# Patient Record
Sex: Male | Born: 1955 | Race: White | Hispanic: No | Marital: Married | Smoking: Never smoker
Health system: Southern US, Community
[De-identification: ages and names within clinical notes are randomized; demographics above are authoritative.]

## PROBLEM LIST (undated history)

## (undated) DIAGNOSIS — I959 Hypotension, unspecified: Secondary | ICD-10-CM

## (undated) DIAGNOSIS — I4891 Unspecified atrial fibrillation: Secondary | ICD-10-CM

## (undated) HISTORY — PX: ABLATION: SHX5711

---

## 2014-03-16 ENCOUNTER — Inpatient Hospital Stay: Payer: Self-pay | Admitting: Internal Medicine

## 2014-03-16 LAB — CBC WITH DIFFERENTIAL/PLATELET
Basophil #: 0.1 10*3/uL (ref 0.0–0.1)
Basophil %: 0.6 %
Eosinophil #: 0.1 10*3/uL (ref 0.0–0.7)
Eosinophil %: 0.5 %
HCT: 49.9 % (ref 40.0–52.0)
HGB: 16.5 g/dL (ref 13.0–18.0)
LYMPHS PCT: 1.8 %
Lymphocyte #: 0.3 10*3/uL — ABNORMAL LOW (ref 1.0–3.6)
MCH: 30.7 pg (ref 26.0–34.0)
MCHC: 33.1 g/dL (ref 32.0–36.0)
MCV: 93 fL (ref 80–100)
MONO ABS: 0.5 x10 3/mm (ref 0.2–1.0)
Monocyte %: 3.5 %
NEUTROS ABS: 14.3 10*3/uL — AB (ref 1.4–6.5)
Neutrophil %: 93.6 %
PLATELETS: 177 10*3/uL (ref 150–440)
RBC: 5.38 10*6/uL (ref 4.40–5.90)
RDW: 13.7 % (ref 11.5–14.5)
WBC: 15.2 10*3/uL — ABNORMAL HIGH (ref 3.8–10.6)

## 2014-03-16 LAB — URINALYSIS, COMPLETE
BACTERIA: NONE SEEN
BILIRUBIN, UR: NEGATIVE
BLOOD: NEGATIVE
Glucose,UR: NEGATIVE mg/dL (ref 0–75)
Leukocyte Esterase: NEGATIVE
Nitrite: NEGATIVE
Ph: 5 (ref 4.5–8.0)
Protein: NEGATIVE
Specific Gravity: 1.027 (ref 1.003–1.030)
WBC UR: 7 /HPF (ref 0–5)

## 2014-03-16 LAB — COMPREHENSIVE METABOLIC PANEL
ANION GAP: 6 — AB (ref 7–16)
AST: 21 U/L (ref 15–37)
Albumin: 4 g/dL (ref 3.4–5.0)
Alkaline Phosphatase: 65 U/L
BUN: 26 mg/dL — ABNORMAL HIGH (ref 7–18)
Bilirubin,Total: 0.9 mg/dL (ref 0.2–1.0)
CALCIUM: 9.4 mg/dL (ref 8.5–10.1)
CHLORIDE: 105 mmol/L (ref 98–107)
CO2: 29 mmol/L (ref 21–32)
CREATININE: 0.99 mg/dL (ref 0.60–1.30)
Glucose: 124 mg/dL — ABNORMAL HIGH (ref 65–99)
OSMOLALITY: 286 (ref 275–301)
POTASSIUM: 4.7 mmol/L (ref 3.5–5.1)
SGPT (ALT): 31 U/L
Sodium: 140 mmol/L (ref 136–145)
Total Protein: 7.5 g/dL (ref 6.4–8.2)

## 2014-03-16 LAB — TROPONIN I: Troponin-I: <0.02 ng/mL

## 2014-03-16 LAB — CLOSTRIDIUM DIFFICILE(ARMC)

## 2014-03-16 LAB — LIPASE, BLOOD: Lipase: 157 U/L (ref 73–393)

## 2014-03-19 ENCOUNTER — Telehealth: Payer: Self-pay | Admitting: Physician Assistant

## 2014-03-19 NOTE — Telephone Encounter (Signed)
Spoke with patient and spouse on the speaker phone. The patient reports he has had 3 loose stools today that "sank to the bottom of the toilet like coffee grounds". He denies nausea or vomiting. He puts his abdominal discomfort on a 2 (1 to 10 scale) and states it is not as bad as when he went to the ER. He has consulted another doctor already today about his present symptoms. States he was told to try Imodium and to the ER if he acutely worsens or fails to improve. Encouraged the patient to follow these recommendations.

## 2014-03-21 ENCOUNTER — Ambulatory Visit: Payer: Self-pay | Admitting: Gastroenterology

## 2014-03-26 ENCOUNTER — Ambulatory Visit: Payer: Self-pay | Admitting: Physician Assistant

## 2014-03-26 ENCOUNTER — Encounter: Payer: Self-pay | Admitting: *Deleted

## 2014-03-26 NOTE — Progress Notes (Unsigned)
Patient ID: Beckey RutterDavid B Rodenberg, male   DOB: [birth year], 59 y.o.   MRN: [id number] Per Mike GipAmy Esterwood PA-C: Patient did not show for his appointment that [facility], [place], had made . Amy said to not call the patient. If he calls us we will be glad to see him.

## 2014-07-13 NOTE — H&P (Signed)
PATIENT NAMEASHTYN, [name] MR#:  [id number] DATE OF BIRTH:  03-13-1956  DATE OF ADMISSION:  03/16/2014  PRIMARY CARE PHYSICIAN: Brett Presto, MD; [facility] primary care physician   CHIEF COMPLAINT: Abdominal pain, nausea and vomiting, for 2 days.   HISTORY OF PRESENT ILLNESS: [name] is a 59 year old Caucasian gentleman with a past history of arrhythmia, who underwent cardiac ablation in 2011, comes to the Emergency Room after he started having significant nausea, vomiting, and abdominal pain, generalized for the last 2 days. The patient has been retching, vomiting and had some diarrheal stools which were nonbloody. He feels "miserable" present in the Emergency Room. Work-up in the Emergency Room showed the patient has enteritis/colitis. He is receiving a dose of IV Cipro and Flagyl. He is being admitted for further evaluation and management.   PAST MEDICAL HISTORY:  The patient was on a cruise in December, the 2nd week, and had similar symptoms with nausea, vomiting, and abdominal pain; however, of milder intensity per wife. He was quarantined in the room, along with given some supportive treatment on the cruise ship. His symptoms subsided, and thereafter started again since yesterday. Past medical history of cardiac ablation for arrhythmia in 2011.   MEDICATIONS: None.   ALLERGIES: No known drug allergies.   SOCIAL HISTORY: Nonsmoker, nonalcoholic. Married. Lives in [place].  FAMILY HISTORY: Mother died of metastatic breast cancer. Father died of ruptured aortic aneurysm.   REVIEW OF SYSTEMS:  CONSTITUTIONAL: Positive for fatigue, weakness. No fever.  EYES: No blurred or double vision.  EARS, NOSE, AND THROAT: No tinnitus, ear pain, hearing loss.  RESPIRATORY: No cough, wheeze, hemoptysis.  CARDIOVASCULAR: No chest pain, orthopnea, edema, or hypertension.  GASTROINTESTINAL: Positive for nausea, vomiting, abdominal pain. No melena or constipation.  GENITOURINARY: No dysuria, hematuria,  or frequency.  ENDOCRINE: No polyuria, nocturia or thyroid problems.  HEMATOLOGY: No anemia or easy bruising.  SKIN: No acne, rash or lesion.  MUSCULOSKELETAL: No arthritis, swelling, or gout.  NEUROLOGIC: No CVA, TIA, dysarthria, or dementia.  PSYCHIATRIC: No anxiety, depression, or bipolar disorder.  All other systems reviewed are negative.   PHYSICAL EXAMINATION:  GENERAL: The patient is awake, alert, oriented x 3, not in acute distress.  VITAL SIGNS: Afebrile. Pulse is 77. Blood pressure 125/69. Saturations are 97% on room air.  HEENT: Atraumatic, normocephalic. Pupils are equal, round and reactive to light and accommodation. Extraocular muscles are intact. Oral mucosa is dry.  NECK: Supple. No JVD. No carotid bruit.  LUNGS: Clear to auscultation bilaterally. No rales, rhonchi, respiratory distress or labored breathing.  HEART: Both the heart sounds are normal. Rate and rhythm regular. PMI not lateralized. Chest nontender.  EXTREMITIES: Good pedal pulses, good femoral pulses. No lower extremity edema.  ABDOMEN: Distended, soft; however, generalized tenderness is present. No guarding or rigidity. Hypoactive bowel sounds. No abdominal mass felt.  NEUROLOGIC: Grossly intact cranial nerves II through XII. No motor or sensory deficits.  PSYCHIATRIC: The patient is awake, alert, oriented x 3.   DIAGNOSTIC STUDIES: CT of the abdomen and pelvis with contrast shows multiple dilated small bowel loops with minimal wall thickening of a short segment distal small bowel loop, as well as mild wall thickening of transverse colon. No significant free fluid or inflammatory changes within the mesentery. Findings are likely due to infectious enteritis/colitis. Horseshoe kidney. Subcentimeter hypodensity over the left-sided component of this horseshoe kidney; likely a cyst, but too small to characterize.   UA negative for UTI. White count 15.2. Basic metabolic panel  within normal limits.   ASSESSMENT: This  is 59 year old [name] with a history of cardiac ablation secondary to arrhythmia in 2011, comes in with nausea, vomiting, and diarrhea for 2 days. He is being admitted with:  1.  Acute enteritis/colitis. The patient's CT scan his abdominal is evident with the above findings. We will admit the patient to the medical floor. We will give him ice chips and sips of water for now. Continue IV fluids for hydration. We will also continue IV Cipro and Flagyl and follow blood cultures. GI consultation will be considered if the patient's symptoms do not improve. Will monitor white count.  2.  Intractable nausea and vomiting. P.r.n. Zofran and Phenergan.  3.  Generalized abdominal pain due acute enteritis/colitis. Will give IV Dilaudid p.r.n. to see if this helps with pain. The patient received earlier morphine in the Emergency Room; did not seem to comfort him as much.  4.  Deep vein thrombosis prophylaxis. Subcutaneous heparin t.i.d.   The above was discussed with the patient and the patient's wife in the Emergency Room. Further workup according to the patient's clinical course.   TIME SPENT: 45 minutes.    ____________________________ Brett HailSona A. Allena KatzPatel, MD sap:MT D: 03/16/2014 08:18:08 ET T: 03/16/2014 08:51:08 ET JOB#: [id number]  cc: Brett Coryell A. Allena KatzPatel, MD, <Dictator> Brett PrestoAlexandre Huin, MD Willow OraSONA A Yolando Gillum MD ELECTRONICALLY SIGNED 03/19/2014 18:42

## 2014-07-15 LAB — SURGICAL PATHOLOGY

## 2014-07-17 NOTE — Discharge Summary (Signed)
PATIENT NAME:  [name], [name] DATE OF BIRTH:  June 24, 1955  DATE OF ADMISSION:  03/16/2014 DATE OF DISCHARGE:  03/18/2014  ADMITTING DIAGNOSIS: Abdominal pain, nausea, vomiting for 2 days.   DISCHARGE DIAGNOSES:  1.  Abdominal pain, nausea, vomiting, likely due to an enteritis as well as a possible colitis, likely infectious in nature, negative for stool for Clostridium difficile.  2.  Intractable nausea and vomiting due to acute enteritis colitis.  3.  Generalized abdominal pain due to acute enteritis colitis, now improved.   CONSULTANTS: None.    PERTINENT LABORATORIES AND EVALUATIONS: Admitting glucose 124, BUN 26, creatinine 0.99, sodium 140, potassium 4.7, chloride 105, CO2 of 29, calcium is 9.4. Lipase is 157. LFTs were normal. Troponin less than 0.02. WBC 15.2, hemoglobin 16.5, platelet count was 177. Clostridium difficile was negative. CT scan of the abdomen and pelvis showed multiple dilated small bowel loops with minimal wall thickening of a short segment distal small bowel loop as well as mild wall thickening of the transverse colon, no free fluid or inflammatory changes within the mesentery. Findings are likely due to infectious enteritis colitis.   HOSPITAL COURSE: Please refer to H and P done by the admitting physician. The patient is a 59 year old white male who was in a cruise approximately 2 weeks ago when he got sick there with  diarrhea. At that time he was treated with supportive care. Then things improved and he did not have any symptoms and then came back, he started having symptoms 2 days prior to admission. The patient came to the ED, was noted to have colitis on CT scan. He was admitted, given IV fluids, placed on antibiotics. By the time when I saw him he stated that he is feeling much better and his abdominal pain and diarrhea are resolved. He was very anxious to go home. At this time his symptoms are significantly improved.  He  is stable for discharge with  outpatient GI followup.   DISCHARGE MEDICATIONS: Cipro 500 mg 1 tab p.o. q. 12 x 10 days, Flagyl 500 mg 1 tab p.o. q. 8 x 10 days.   DIET: Low residual diet, low fat.    ACTIVITY: As tolerated.   FOLLOWUP:  With GI in 1-2 weeks, if symptoms do not improve needs to see GI earlier.   TIME SPENT: 35 minutes.     ____________________________ Lacie ScottsShreyang H. Allena KatzPatel, MD shp:bu D: 03/20/2014 13:02:30 ET T: 03/20/2014 14:52:16 ET JOB#: [id number]  cc: Iktan Aikman H. Allena KatzPatel, MD, <Dictator> Charise CarwinSHREYANG H Adelisa Satterwhite MD ELECTRONICALLY SIGNED 03/24/2014 12:20

## 2015-04-16 ENCOUNTER — Telehealth: Payer: Self-pay | Admitting: Gastroenterology

## 2015-04-16 NOTE — Telephone Encounter (Signed)
Patient was in Dec 2015 and infectious colitis from a cruise and just came back from another cruise with the same pain. No bowel movement from Sunday. Patients wife feels he needs to get in to see Dr. Servando Snare now. Please call.

## 2015-04-16 NOTE — Telephone Encounter (Signed)
Returned pt's wife call regarding husbands symptoms. She stated she hasn't had a bm since Monday. Advised her he probably hasn't had one due to the different food and water that he had on his cruise. Recommended starting pt on a daily miralax to see if this will get his bowels moving. Add prune juice and increase fluids. If this doesn't help after a couple of days she can add in Milk of Magnesia. If no improvement or he starts having severe abdominal pain, she is to call me back.

## 2015-04-17 ENCOUNTER — Telehealth: Payer: Self-pay

## 2015-04-17 NOTE — Telephone Encounter (Signed)
Spoke with pt's wife regarding his constipation issues. She noted that yesterday he started having dark stools. No abdominal pain, nausea or vomiting noted. Pt today had a bowel movement and it was normal. She stated he was feeling better. Advised her per Dr. Servando Snare, if his stools become dark and tarry he should go to the ER as he may have a GI bleed. Told her Dr Servando Snare is on call today until tomorrow morning if he develops any more issues he will be available. She understood and will go to ER if things change.

## 2015-06-12 ENCOUNTER — Other Ambulatory Visit: Payer: Self-pay | Admitting: Student

## 2015-06-12 DIAGNOSIS — M5416 Radiculopathy, lumbar region: Secondary | ICD-10-CM

## 2015-06-24 ENCOUNTER — Ambulatory Visit
Admission: RE | Admit: 2015-06-24 | Discharge: 2015-06-24 | Disposition: A | Discharge status: Home / Self Care | Payer: BLUE CROSS/BLUE SHIELD | Source: Ambulatory Visit | Attending: Student | Admitting: Student

## 2015-06-24 DIAGNOSIS — M4806 Spinal stenosis, lumbar region: Secondary | ICD-10-CM | POA: Insufficient documentation

## 2015-06-24 DIAGNOSIS — M5416 Radiculopathy, lumbar region: Secondary | ICD-10-CM | POA: Diagnosis present

## 2015-07-16 ENCOUNTER — Ambulatory Visit: Payer: Self-pay

## 2018-11-21 ENCOUNTER — Emergency Department
Admission: EM | Admit: 2018-11-21 | Discharge: 2018-11-22 | Disposition: A | Discharge status: Home / Self Care | Payer: BC Managed Care – PPO | Attending: Emergency Medicine | Admitting: Emergency Medicine

## 2018-11-21 ENCOUNTER — Other Ambulatory Visit: Payer: Self-pay

## 2018-11-21 ENCOUNTER — Encounter: Payer: Self-pay | Admitting: Emergency Medicine

## 2018-11-21 DIAGNOSIS — I4891 Unspecified atrial fibrillation: Secondary | ICD-10-CM | POA: Diagnosis not present

## 2018-11-21 DIAGNOSIS — R197 Diarrhea, unspecified: Secondary | ICD-10-CM | POA: Insufficient documentation

## 2018-11-21 DIAGNOSIS — R319 Hematuria, unspecified: Secondary | ICD-10-CM | POA: Diagnosis not present

## 2018-11-21 DIAGNOSIS — I951 Orthostatic hypotension: Secondary | ICD-10-CM | POA: Diagnosis not present

## 2018-11-21 DIAGNOSIS — R42 Dizziness and giddiness: Secondary | ICD-10-CM | POA: Diagnosis present

## 2018-11-21 LAB — CBC
HCT: 44.6 % (ref 39.0–52.0)
Hemoglobin: 15.3 g/dL (ref 13.0–17.0)
MCH: 31.6 pg (ref 26.0–34.0)
MCHC: 34.3 g/dL (ref 30.0–36.0)
MCV: 92.1 fL (ref 80.0–100.0)
Platelets: 188 10*3/uL (ref 150–400)
RBC: 4.84 MIL/uL (ref 4.22–5.81)
RDW: 12.3 % (ref 11.5–15.5)
WBC: 6.8 10*3/uL (ref 4.0–10.5)
nRBC: 0 % (ref 0.0–0.2)

## 2018-11-21 LAB — BASIC METABOLIC PANEL
Anion gap: 8 (ref 5–15)
BUN: 20 mg/dL (ref 8–23)
CO2: 27 mmol/L (ref 22–32)
Calcium: 8.9 mg/dL (ref 8.9–10.3)
Chloride: 107 mmol/L (ref 98–111)
Creatinine, Ser: 0.89 mg/dL (ref 0.61–1.24)
GFR calc Af Amer: >60 mL/min (ref >60)
GFR calc non Af Amer: >60 mL/min (ref >60)
Glucose, Bld: 96 mg/dL (ref 70–99)
Potassium: 4.1 mmol/L (ref 3.5–5.1)
Sodium: 142 mmol/L (ref 135–145)

## 2018-11-21 MED ORDER — SODIUM CHLORIDE 0.9 % IV BOLUS
1000.0000 mL | Freq: Once | INTRAVENOUS | Status: AC
  Administered 2018-11-21: 1000 mL via INTRAVENOUS

## 2018-11-21 NOTE — ED Notes (Signed)
Family member in from outside (where patient is sitting). Updated on wait time and process of triage/different areas of the ER.

## 2018-11-21 NOTE — ED Triage Notes (Signed)
Patient states he has been getting dizzy with change of position for the last few months. States the dizziness usually lasts only a short time. Patient reports today, dizziness has been persistent for the last 7-8 hours. Patient went to [facility] and was found to be orthostatic with standing BP 87/65.

## 2018-11-21 NOTE — ED Notes (Signed)
Updated on lab results

## 2018-11-21 NOTE — ED Provider Notes (Signed)
[facility] Emergency Department Provider Note  ____________________________________________   First MD Initiated Contact with Patient 11/21/18 2258     (approximate)  I have reviewed the triage vital signs and the nursing notes.   HISTORY  Chief Complaint Dizziness and Hypotension    HPI [name] is a 63 y.o. male with history of ablation who presents with dizziness.  [facility] was found to be orthostatic with standing blood pressure of 87/65.  Patient's dizziness is intermittent, does not occur at rest, comes on with standing, has not taking anything that makes it better, onset today.  However patient does endorse having this occasionally in the past as well.  He denies current dizziness at this time.  He did have an episode of multiple episodes of diarrhea 2 or 3 days ago.  Denies any current diarrhea.  Denies any chest pain, shortness of breath, headache, difficulty with ambulating.  He does have a history of atrial fibrillation and he was worried because his watch was intermittently reading atrial fibrillation however is not currently doing that now.       History reviewed. No pertinent past medical history.  There are no active problems to display for this patient.   Past Surgical History:  Procedure Laterality Date   ABLATION      Prior to Admission medications   Not on File    Allergies Patient has no known allergies.  No family history on file.  Social History Social History   Tobacco Use   Smoking status: Never Smoker   Smokeless tobacco: Never Used  Substance Use Topics   Alcohol use: Yes    Comment: occasional   Drug use: Never      Review of Systems Constitutional: No fever/chills Eyes: No visual changes. ENT: No sore throat. Cardiovascular: Denies chest pain. Respiratory: Denies shortness of breath. Gastrointestinal: No abdominal pain.  No nausea, no vomiting.  Positive diarrhea now  resolved no constipation. Genitourinary: Negative for dysuria. Musculoskeletal: Negative for back pain. Skin: Negative for rash. Neurological: Negative for headaches, focal weakness or numbness.  Positive dizziness All other ROS negative ____________________________________________   PHYSICAL EXAM:  VITAL SIGNS: ED Triage Vitals [11/21/18 1846]  Enc Vitals Group     BP 101/83     Pulse Rate 64     Resp 16     Temp 98.2 F (36.8 C)     Temp Source Oral     SpO2 97 %     Weight 215 lb (97.5 kg)     Height 6\' 6"  (1.981 m)     Head Circumference      Peak Flow      Pain Score 0     Pain Loc      Pain Edu?      Excl. in GC?     Constitutional: Alert and oriented. Well appearing and in no acute distress. Eyes: Conjunctivae are normal. EOMI. Head: Atraumatic. Nose: No congestion/rhinnorhea. Mouth/Throat: Mucous membranes are moist.   Neck: No stridor. Trachea Midline. FROM Cardiovascular: Normal rate, regular rhythm. Grossly normal heart sounds.  Good peripheral circulation. Respiratory: Normal respiratory effort.  No retractions. Lungs CTAB. Gastrointestinal: Soft and nontender. No distention. No abdominal bruits.  Musculoskeletal: No lower extremity tenderness nor edema.  No joint effusions. Neurologic:  Normal speech and language.  Cranial nerves II through XII are intact.  Finger-to-nose intact.  Patient is able to stand up and ambulate without any dizziness. Skin:  Skin is warm, dry and intact. No rash noted. Psychiatric: Mood and affect are normal. Speech and behavior are normal. GU: Deferred   ____________________________________________   LABS (all labs ordered are listed, but only abnormal results are displayed)  Labs Reviewed  URINALYSIS, COMPLETE (UACMP) WITH MICROSCOPIC - Abnormal; Notable for the following components:      Result Value   Color, Urine YELLOW (*)    APPearance HAZY (*)    All other components within normal limits  URINE CULTURE  BASIC  METABOLIC PANEL  CBC   ____________________________________________   ED ECG REPORT I, Concha Se, the attending physician, personally viewed and interpreted this ECG.  EKG is normal sinus rate of 73, no ST elevation, no T wave in version.  Biphasic P wave.  No prior to compare to.  ____________________________________________   INITIAL IMPRESSION / ASSESSMENT AND PLAN / ED COURSE  [name] was evaluated in Emergency Department on 11/21/2018 for the symptoms described in the history of present illness. He was evaluated in the context of the global COVID-19 pandemic, which necessitated consideration that the patient might be at risk for infection with the SARS-CoV-2 virus that causes COVID-19. Institutional protocols and algorithms that pertain to the evaluation of patients at risk for COVID-19 are in a state of rapid change based on information released by regulatory bodies including the CDC and federal and state organizations. These policies and algorithms were followed during the patient's care in the ED.    Patient is well-appearing 63 year old with history of atrial fibrillation status post ablation who now presents with intermittent dizziness with standing.  Patient is currently asymptomatic and is ambulating well.  I discussed with patient that this could be secondary to orthostatic hypotension due to his episode of diarrhea.  We will give 1 L of fluid and reassess patient's blood pressures.  Will get labs to evaluate for anemia, electrolyte abnormalities.  Will do EKG to evaluate for atrial fibrillation.  I discussed with patient that he should follow-up with a cardiologist for Holter monitor given his watch has been reading possible atrial fibrillation.  We discussed the utility of cardiac markers and given her very low suspicion for ACS given he is having no chest pain and currently asymptomatic we elected to hold off on troponin.  White count is normal making infection less likely.   Hemoglobin is stable therefore unlikely due to GI bleed.  No evidence of kidney dysfunction.  Patient given 1 L of fluid and repeat orthostatics are normal.  UA with some WBCs and RBCs.  Patient currently denies any dysuria.  Will send for urine culture given low suspicion for UTI.  Patient currently symptomatically feels comfortable with discharge home.  Given patient cardiology number for Holter monitor follow-up.  I discussed the provisional nature of ED diagnosis, the treatment so far, the ongoing plan of care, follow up appointments and return precautions with the patient and any family or support people present. They expressed understanding and agreed with the plan, discharged home.     ____________________________________________   FINAL CLINICAL IMPRESSION(S) / ED DIAGNOSES   Final diagnoses:  Orthostatic hypotension      MEDICATIONS GIVEN DURING THIS VISIT:  Medications  sodium chloride 0.9 % bolus 1,000 mL (0 mLs Intravenous Stopped 11/22/18 0038)     ED Discharge Orders    None       Note:  This document was prepared using Dragon voice recognition software and may include unintentional dictation errors.  Vanessa Point MacKenzie, MD 11/22/18 [phone]

## 2018-11-22 LAB — URINALYSIS, COMPLETE (UACMP) WITH MICROSCOPIC
Bacteria, UA: NONE SEEN
Bilirubin Urine: NEGATIVE
Glucose, UA: NEGATIVE mg/dL
Hgb urine dipstick: NEGATIVE
Ketones, ur: NEGATIVE mg/dL
Leukocytes,Ua: NEGATIVE
Nitrite: NEGATIVE
Protein, ur: NEGATIVE mg/dL
Specific Gravity, Urine: 1.02 (ref 1.005–1.030)
Squamous Epithelial / HPF: NONE SEEN (ref 0–5)
pH: 5 (ref 5.0–8.0)

## 2018-11-22 NOTE — Discharge Instructions (Signed)
Your work-up here was reassuring.  Your blood pressures were better after the the 1 L of fluid.  You should call the cardiologist to discuss getting a Holter monitor.  Your urine did not look like it was infected but we have sent it for urine culture.    Return to the ER for worsening dizziness, shortness of breath, fevers, chest pain or any other concerns

## 2018-11-23 LAB — URINE CULTURE: Culture: NO GROWTH

## 2018-12-17 ENCOUNTER — Encounter: Payer: Self-pay | Admitting: Emergency Medicine

## 2018-12-17 ENCOUNTER — Other Ambulatory Visit: Payer: Self-pay

## 2018-12-17 ENCOUNTER — Emergency Department
Admission: EM | Admit: 2018-12-17 | Discharge: 2018-12-17 | Disposition: A | Discharge status: Home / Self Care | Payer: BC Managed Care – PPO | Attending: Student | Admitting: Student

## 2018-12-17 DIAGNOSIS — R42 Dizziness and giddiness: Secondary | ICD-10-CM

## 2018-12-17 DIAGNOSIS — I4891 Unspecified atrial fibrillation: Secondary | ICD-10-CM | POA: Diagnosis not present

## 2018-12-17 LAB — BASIC METABOLIC PANEL
Anion gap: 6 (ref 5–15)
BUN: 23 mg/dL (ref 8–23)
CO2: 29 mmol/L (ref 22–32)
Calcium: 9.4 mg/dL (ref 8.9–10.3)
Chloride: 104 mmol/L (ref 98–111)
Creatinine, Ser: 0.87 mg/dL (ref 0.61–1.24)
GFR calc Af Amer: >60 mL/min (ref >60)
GFR calc non Af Amer: >60 mL/min (ref >60)
Glucose, Bld: 74 mg/dL (ref 70–99)
Potassium: 4 mmol/L (ref 3.5–5.1)
Sodium: 139 mmol/L (ref 135–145)

## 2018-12-17 LAB — URINE DRUG SCREEN, QUALITATIVE (ARMC ONLY)
Amphetamines, Ur Screen: NOT DETECTED
Barbiturates, Ur Screen: NOT DETECTED
Benzodiazepine, Ur Scrn: NOT DETECTED
Cannabinoid 50 Ng, Ur ~~LOC~~: NOT DETECTED
Cocaine Metabolite,Ur ~~LOC~~: NOT DETECTED
MDMA (Ecstasy)Ur Screen: NOT DETECTED
Methadone Scn, Ur: NOT DETECTED
Opiate, Ur Screen: NOT DETECTED
Phencyclidine (PCP) Ur S: NOT DETECTED
Tricyclic, Ur Screen: NOT DETECTED

## 2018-12-17 LAB — TSH: TSH: 2.01 u[IU]/mL (ref 0.350–4.500)

## 2018-12-17 LAB — CBC
HCT: 47.5 % (ref 39.0–52.0)
Hemoglobin: 16.2 g/dL (ref 13.0–17.0)
MCH: 30.7 pg (ref 26.0–34.0)
MCHC: 34.1 g/dL (ref 30.0–36.0)
MCV: 90 fL (ref 80.0–100.0)
Platelets: 155 10*3/uL (ref 150–400)
RBC: 5.28 MIL/uL (ref 4.22–5.81)
RDW: 11.9 % (ref 11.5–15.5)
WBC: 5.5 10*3/uL (ref 4.0–10.5)
nRBC: 0 % (ref 0.0–0.2)

## 2018-12-17 LAB — URINALYSIS, COMPLETE (UACMP) WITH MICROSCOPIC
Bacteria, UA: NONE SEEN
Bilirubin Urine: NEGATIVE
Glucose, UA: NEGATIVE mg/dL
Ketones, ur: NEGATIVE mg/dL
Leukocytes,Ua: NEGATIVE
Nitrite: NEGATIVE
Protein, ur: NEGATIVE mg/dL
Specific Gravity, Urine: 1.011 (ref 1.005–1.030)
Squamous Epithelial / HPF: NONE SEEN (ref 0–5)
pH: 7 (ref 5.0–8.0)

## 2018-12-17 LAB — TROPONIN I (HIGH SENSITIVITY): Troponin I (High Sensitivity): 9 ng/L (ref <18)

## 2018-12-17 LAB — MAGNESIUM: Magnesium: 2.4 mg/dL (ref 1.7–2.4)

## 2018-12-17 LAB — T4, FREE: Free T4: 0.88 ng/dL (ref 0.61–1.12)

## 2018-12-17 MED ORDER — DILTIAZEM HCL 25 MG/5ML IV SOLN
15.0000 mg | Freq: Once | INTRAVENOUS | Status: DC
  Filled 2018-12-17: qty 5

## 2018-12-17 MED ORDER — DILTIAZEM HCL ER COATED BEADS 120 MG PO CP24: 120.0000 mg | ORAL_CAPSULE | Freq: Every day | ORAL | 0 refills | Status: DC

## 2018-12-17 MED ORDER — DILTIAZEM HCL ER COATED BEADS 120 MG PO CP24
120.0000 mg | ORAL_CAPSULE | Freq: Once | ORAL | Status: AC
  Administered 2018-12-17: 14:00:00 120 mg via ORAL
  Filled 2018-12-17: qty 1

## 2018-12-17 MED ORDER — DILTIAZEM HCL 25 MG/5ML IV SOLN
10.0000 mg | Freq: Once | INTRAVENOUS | Status: AC
  Administered 2018-12-17: 11:00:00 10 mg via INTRAVENOUS

## 2018-12-17 MED ORDER — APIXABAN 5 MG PO TABS: 5.0000 mg | ORAL_TABLET | Freq: Two times a day (BID) | ORAL | 0 refills | Status: DC

## 2018-12-17 MED ORDER — SODIUM CHLORIDE 0.9 % IV BOLUS
1000.0000 mL | Freq: Once | INTRAVENOUS | Status: AC
  Administered 2018-12-17: 11:00:00 1000 mL via INTRAVENOUS

## 2018-12-17 NOTE — ED Triage Notes (Signed)
Pt arrived via POV, reports that when he got out of bed he got dizzy getting out of bed. Pt states the dizziness would last a few seconds then go away. Pt states it would come back if he laid down and got back up again.  Pt states he was seen here about a month ago for the same and was hypotensive.  Pt states his apple watch says he is in A-fib which he states he has had an ablation in the past.

## 2018-12-17 NOTE — ED Provider Notes (Signed)
[facility] Emergency Department Provider Note  ____________________________________________   First MD Initiated Contact with Patient 12/17/18 1053     (approximate)  I have reviewed the triage vital signs and the nursing notes.  History  Chief Complaint Dizziness    HPI [name] is a 63 y.o. male with history of pAF s/p ablation (2011 at [facility]) who presents for dizziness/lightheadedness.  Patient states when he woke up this morning, he was feeling lightheaded.  Symptoms worsened especially with positional changes, such as going from sitting to standing.  Improved when he rested and steadied his head between his knees.  He denies any shortness of breath, chest pain, or loss of consciousness.  He checked his Apple Watch, and noted that he was in atrial fibrillation, and therefore presented to the emergency department for further evaluation.  In the emergency department, while at rest in the stretcher he denies any acute complaints. He states he can only feel palpitations if he really focuses on it.  On chart review, it appears he was recently seen in the cardiology clinic for evaluation of similar symptoms - lightheadedness/dizziness.  He had a Holter monitor worn for 1 week without any A. fib events.  He denies any recent illnesses, no fevers, nausea, vomiting, diarrhea.   Past Medical Hx History reviewed. No pertinent past medical history.  Problem List There are no active problems to display for this patient.   Past Surgical Hx Past Surgical History:  Procedure Laterality Date  . ABLATION      Medications Prior to Admission medications   Not on File    Allergies Patient has no known allergies.  Family Hx History reviewed. No pertinent family history.  Social Hx Social History   Tobacco Use  . Smoking status: Never Smoker  . Smokeless tobacco: Never Used  Substance Use Topics  . Alcohol use: Yes    Comment: occasional  . Drug  use: Never     Review of Systems  Constitutional: Negative for fever, chills. + lightheadedness Eyes: Negative for visual changes. ENT: Negative for sore throat. Cardiovascular: Negative for chest pain. Respiratory: Negative for shortness of breath. Gastrointestinal: Negative for nausea, vomiting.  Genitourinary: Negative for dysuria. Musculoskeletal: Negative for leg swelling. Skin: Negative for rash. Neurological: Negative for for headaches.   Physical Exam  Vital Signs: ED Triage Vitals  Enc Vitals Group     BP 12/17/18 1043 110/68     Pulse Rate 12/17/18 1043 60     Resp 12/17/18 1043 18     Temp --      Temp src --      SpO2 12/17/18 1043 99 %     Weight 12/17/18 1042 210 lb (95.3 kg)     Height 12/17/18 1042 6\' 6"  (1.981 m)     Head Circumference --      Peak Flow --      Pain Score 12/17/18 1042 0     Pain Loc --      Pain Edu? --      Excl. in [place]? --     Constitutional: Alert and oriented.  Head: Normocephalic. Atraumatic. Eyes: Conjunctivae clear. Sclera anicteric. Nose: No congestion. No rhinorrhea. Mouth/Throat: Mucous membranes are moist.  Neck: No stridor.   Cardiovascular: Tachycardic, irregularly irregular. No murmurs. Extremities well perfused. Respiratory: Normal respiratory effort.  Lungs CTAB. Gastrointestinal: Soft. Non-tender. Non-distended.  Musculoskeletal: No lower extremity edema. No deformities. Neurologic:  Normal speech and language. No gross focal neurologic  deficits are appreciated.  Skin: Skin is warm, dry and intact. No rash noted. Psychiatric: Mood and affect are appropriate for situation.  EKG  Personally reviewed.   Rate: 140s Rhythm: Atrial fibrillation Axis: Leftward Intervals: QTC 469 ms Atrial fibrillation with RVR No STEMI  Repeat at 12:21 PM s/p diltiazem 10 mg IV. Atrial fibrillation Rate 70s Leftward axis Normal intervals No acute ischemic changes    Radiology  N/A   Procedures  Procedure(s)  performed (including critical care):  .Critical Care Performed by: Miguel Aschoff., MD Authorized by: Miguel Aschoff., MD   Critical care provider statement:    Critical care time (minutes):  45   Critical care was necessary to treat or prevent imminent or life-threatening deterioration of the following conditions: AF with RVR.   Critical care was time spent personally by me on the following activities:  Discussions with consultants, evaluation of patient's response to treatment, examination of patient, ordering and performing treatments and interventions, ordering and review of laboratory studies, ordering and review of radiographic studies, pulse oximetry, re-evaluation of patient's condition, obtaining history from patient or surrogate and review of old charts     Initial Impression / Assessment and Plan / ED Course  63 y.o. male who presents to the ED for lightheadedness, dizziness, found to be in A. fib with RVR.  History of paroxysmal AF s/p ablation in 2011.  Ddx: paroxysmal AF, underlying electrolyte abnormality, thyroid disease, dehydration  Plan: will obtain labs, attempt rate control, fluids, reassess  S/p 10 mg IV diltiazem and fluids, and patient has remained in rate control (atrial fibrillation, HR 60-90s) for over 1 hour. Lab work without actionable derangements.  Discussed with cardiology, given he has remained stable, will give PO dose of Cardizem CD (120 mg) here, and plan for discharge with Rx of Cardizem and Eliquis. Cardiology will follow him in clinic this week. Discussed plan with patient and wife at bedside, who are agreeable. Discussed in detail the benefits, side effects, and risks of Eliquis as well as Cardizem. He is agreeable to taking. Given return precautions. Stable for discharge.    Final Clinical Impression(s) / ED Diagnosis  Final diagnoses:  Atrial fibrillation with RVR (HCC)  Dizziness       Note:  This document was prepared using Dragon voice  recognition software and may include unintentional dictation errors.   Miguel Aschoff., MD 12/17/18 Barry Brunner

## 2018-12-17 NOTE — ED Notes (Signed)
Pt verbalized understanding of discharge instructions. NAD at this time. 

## 2018-12-17 NOTE — ED Notes (Signed)
First Nurse Note: Pt to ED c/o dizziness. Pt states that he has hx/o A fib. Pt is in NAD.

## 2018-12-17 NOTE — Discharge Instructions (Addendum)
Thank you for letting us take care of you in the emergency department today.   Please continue to take any regular, prescribed medications.   New medications we have prescribed:  - Diltiazem - Eliquis  Please follow up with: [facility] in the clinic  Please return to the ER for any new or worsening symptoms.

## 2018-12-18 LAB — T3, FREE: T3, Free: 3.5 pg/mL (ref 2.0–4.4)

## 2019-01-04 ENCOUNTER — Other Ambulatory Visit: Payer: Self-pay | Admitting: Cardiology

## 2019-01-04 DIAGNOSIS — I7121 Aneurysm of the ascending aorta, without rupture: Secondary | ICD-10-CM

## 2019-01-04 DIAGNOSIS — I719 Aortic aneurysm of unspecified site, without rupture: Secondary | ICD-10-CM

## 2019-01-05 ENCOUNTER — Ambulatory Visit
Admission: RE | Admit: 2019-01-05 | Discharge: 2019-01-05 | Disposition: A | Discharge status: Home / Self Care | Payer: BC Managed Care – PPO | Source: Ambulatory Visit | Attending: Cardiology | Admitting: Cardiology

## 2019-01-05 ENCOUNTER — Other Ambulatory Visit: Payer: Self-pay

## 2019-01-05 DIAGNOSIS — I719 Aortic aneurysm of unspecified site, without rupture: Secondary | ICD-10-CM | POA: Diagnosis present

## 2019-01-05 DIAGNOSIS — I7121 Aneurysm of the ascending aorta, without rupture: Secondary | ICD-10-CM

## 2019-01-05 MED ORDER — IOHEXOL 350 MG/ML SOLN
125.0000 mL | Freq: Once | INTRAVENOUS | Status: AC | PRN
  Administered 2019-01-05: 125 mL via INTRAVENOUS

## 2019-01-15 ENCOUNTER — Ambulatory Visit: Payer: BC Managed Care – PPO

## 2019-06-01 ENCOUNTER — Encounter: Payer: Self-pay | Admitting: Emergency Medicine

## 2019-06-01 ENCOUNTER — Other Ambulatory Visit: Payer: Self-pay

## 2019-06-01 ENCOUNTER — Observation Stay
Admission: EM | Admit: 2019-06-01 | Discharge: 2019-06-02 | Disposition: A | Discharge status: Home / Self Care | Payer: BC Managed Care – PPO | Attending: Internal Medicine | Admitting: Internal Medicine

## 2019-06-01 ENCOUNTER — Other Ambulatory Visit
Admission: RE | Admit: 2019-06-01 | Discharge: 2019-06-01 | Disposition: A | Discharge status: Home / Self Care | Payer: BC Managed Care – PPO | Source: Ambulatory Visit | Attending: Internal Medicine | Admitting: Internal Medicine

## 2019-06-01 ENCOUNTER — Emergency Department: Payer: BC Managed Care – PPO

## 2019-06-01 DIAGNOSIS — I480 Paroxysmal atrial fibrillation: Secondary | ICD-10-CM | POA: Diagnosis not present

## 2019-06-01 DIAGNOSIS — R350 Frequency of micturition: Secondary | ICD-10-CM | POA: Insufficient documentation

## 2019-06-01 DIAGNOSIS — I248 Other forms of acute ischemic heart disease: Secondary | ICD-10-CM | POA: Insufficient documentation

## 2019-06-01 DIAGNOSIS — I214 Non-ST elevation (NSTEMI) myocardial infarction: Secondary | ICD-10-CM

## 2019-06-01 DIAGNOSIS — R531 Weakness: Secondary | ICD-10-CM | POA: Diagnosis present

## 2019-06-01 DIAGNOSIS — R748 Abnormal levels of other serum enzymes: Secondary | ICD-10-CM | POA: Diagnosis not present

## 2019-06-01 DIAGNOSIS — U071 COVID-19: Principal | ICD-10-CM

## 2019-06-01 DIAGNOSIS — Z7982 Long term (current) use of aspirin: Secondary | ICD-10-CM | POA: Diagnosis not present

## 2019-06-01 HISTORY — DX: Unspecified atrial fibrillation: I48.91

## 2019-06-01 HISTORY — DX: Hypotension, unspecified: I95.9

## 2019-06-01 LAB — HEMOGLOBIN A1C
Hgb A1c MFr Bld: 5.2 % (ref 4.8–5.6)
Mean Plasma Glucose: 102.54 mg/dL

## 2019-06-01 LAB — BASIC METABOLIC PANEL
Anion gap: 9 (ref 5–15)
BUN: 23 mg/dL (ref 8–23)
CO2: 27 mmol/L (ref 22–32)
Calcium: 8.6 mg/dL — ABNORMAL LOW (ref 8.9–10.3)
Chloride: 104 mmol/L (ref 98–111)
Creatinine, Ser: 1.08 mg/dL (ref 0.61–1.24)
GFR calc Af Amer: >60 mL/min (ref >60)
GFR calc non Af Amer: >60 mL/min (ref >60)
Glucose, Bld: 89 mg/dL (ref 70–99)
Potassium: 4.2 mmol/L (ref 3.5–5.1)
Sodium: 140 mmol/L (ref 135–145)

## 2019-06-01 LAB — PROTIME-INR
INR: 1.1 (ref 0.8–1.2)
Prothrombin Time: 13.9 seconds (ref 11.4–15.2)

## 2019-06-01 LAB — C-REACTIVE PROTEIN: CRP: 0.7 mg/dL (ref <1.0)

## 2019-06-01 LAB — LACTATE DEHYDROGENASE: LDH: 141 U/L (ref 98–192)

## 2019-06-01 LAB — HEPARIN LEVEL (UNFRACTIONATED): Heparin Unfractionated: <0.1 IU/mL — ABNORMAL LOW (ref 0.30–0.70)

## 2019-06-01 LAB — CBC
HCT: 47.1 % (ref 39.0–52.0)
Hemoglobin: 15.6 g/dL (ref 13.0–17.0)
MCH: 30.2 pg (ref 26.0–34.0)
MCHC: 33.1 g/dL (ref 30.0–36.0)
MCV: 91.1 fL (ref 80.0–100.0)
Platelets: 138 10*3/uL — ABNORMAL LOW (ref 150–400)
RBC: 5.17 MIL/uL (ref 4.22–5.81)
RDW: 12.9 % (ref 11.5–15.5)
WBC: 4.1 10*3/uL (ref 4.0–10.5)
nRBC: 0 % (ref 0.0–0.2)

## 2019-06-01 LAB — FERRITIN: Ferritin: 324 ng/mL (ref 24–336)

## 2019-06-01 LAB — TYPE AND SCREEN
ABO/RH(D): A POS
Antibody Screen: NEGATIVE

## 2019-06-01 LAB — TROPONIN I (HIGH SENSITIVITY)
Troponin I (High Sensitivity): 146 ng/L (ref <18)
Troponin I (High Sensitivity): 122 ng/L (ref <18)
Troponin I (High Sensitivity): 96 ng/L — ABNORMAL HIGH (ref <18)
Troponin I (High Sensitivity): 98 ng/L — ABNORMAL HIGH (ref <18)

## 2019-06-01 LAB — APTT
aPTT: 35 seconds (ref 24–36)
aPTT: >160 seconds (ref 24–36)

## 2019-06-01 LAB — FIBRIN DERIVATIVES D-DIMER (ARMC ONLY): Fibrin derivatives D-dimer (ARMC): 441.21 ng/mL (FEU) (ref 0.00–499.00)

## 2019-06-01 MED ORDER — ACETAMINOPHEN 325 MG PO TABS: 650.0000 mg | ORAL_TABLET | Freq: Four times a day (QID) | ORAL | Status: DC | PRN

## 2019-06-01 MED ORDER — HEPARIN BOLUS VIA INFUSION
4000.0000 [IU] | Freq: Once | INTRAVENOUS | Status: AC
  Administered 2019-06-01: 15:00:00 4000 [IU] via INTRAVENOUS
  Filled 2019-06-01: qty 4000

## 2019-06-01 MED ORDER — ACETAMINOPHEN 650 MG RE SUPP: 650.0000 mg | Freq: Four times a day (QID) | RECTAL | Status: DC | PRN

## 2019-06-01 MED ORDER — INFLUENZA VAC SPLIT QUAD 0.5 ML IM SUSY: 0.5000 mL | PREFILLED_SYRINGE | INTRAMUSCULAR | Status: DC

## 2019-06-01 MED ORDER — HEPARIN (PORCINE) 25000 UT/250ML-% IV SOLN
1000.0000 [IU]/h | INTRAVENOUS | Status: DC
  Administered 2019-06-01: 1300 [IU]/h via INTRAVENOUS
  Filled 2019-06-01: qty 250

## 2019-06-01 MED ORDER — ASPIRIN 81 MG PO CHEW: 81.0000 mg | CHEWABLE_TABLET | Freq: Every day | ORAL | Status: DC

## 2019-06-01 MED ORDER — ASPIRIN 81 MG PO CHEW
324.0000 mg | CHEWABLE_TABLET | Freq: Once | ORAL | Status: AC
  Administered 2019-06-01: 324 mg via ORAL
  Filled 2019-06-01: qty 4

## 2019-06-01 NOTE — ED Triage Notes (Signed)
Pt reports has had no energy for a few days and went to his MD who drew labs on him and called him today to say he had an abnormal result and to come to the ED.

## 2019-06-01 NOTE — ED Notes (Signed)
Received fax from [facility] showing positive result for COVID from test this morning

## 2019-06-01 NOTE — ED Notes (Signed)
Pt to ED states was sent by [facility] for abnormal labs. States he tested positive for COVID this morning. Reports overall just not feeling well and some dizziness. Denies SHOB and CP.  PT in NAD at this time

## 2019-06-01 NOTE — H&P (Signed)
History and Physical    [name] OVF:[id number] DOB: 1955/05/04 DOA: 06/01/2019  PCP: Brett Kingdom, MD  Patient coming from: home  I have personally briefly reviewed patient's old medical records in [facility]  Chief Complaint: malaise  HPI: [name] is [name] 64 y.o. male with medical history significant of paroxysmal afib who presented to the ED with generalized weakness and fatigue.  He notes about 2 Sundays ago, he felt similarly, but was in afib most of the day, so he attributed this to that.  On Wednesday, he noted he didn't feel well at all.  Had Emmalynn Pinkham mild cough, headache, nausea.  No change in taste or smell.  No fevers.  No CP or SOB.  He notes he felt similarly to the way he does when he's in afib, but he was not in afib when he looked at his apple watch.  He also notes urinary frequency.  Notes diarrhea Annick Dimaio few weeks ago.  He was seen in the walk in [place] [facility] and was found to be covid positive.  He presented here with positive tropoin.  ED Course: Labs, imaging, EKG, CXR.  Heparin gtt.   Review of Systems: As per HPI otherwise 10 point review of systems negative.   Past Medical History:  Diagnosis Date  . Jonanthony Nahar-fib (HCC)   . Hypotension     Past Surgical History:  Procedure Laterality Date  . ABLATION       reports that he has never smoked. He has never used smokeless tobacco. He reports current alcohol use. He reports that he does not use drugs.  No Known Allergies  History reviewed. No pertinent family history. Father with hx valve repalcement  Prior to Admission medications   Medication Sig Start Date End Date Taking? Authorizing Provider  apixaban (ELIQUIS) 5 MG TABS tablet Take 5 mg by mouth 2 (two) times daily as needed (uncorrectable AFib episode).   Yes [provider]  aspirin EC 81 MG tablet Take 81 mg by mouth daily.   Yes [provider]  Coenzyme Q10 (COQ10) 100 MG CAPS Take 100 mg by mouth daily.   Yes [provider]  diltiazem (DILACOR XR) 120 MG 24 hr capsule Take 120 mg by mouth daily.   Yes [provider]  valACYclovir (VALTREX) 1000 MG tablet Take 1,000 mg by mouth 2 (two) times daily as needed (breakout).   Yes [provider]    Physical Exam: Vitals:   06/01/19 1530 06/01/19 1600 06/01/19 1630 06/01/19 1742  BP: 120/83 124/79 118/83 111/66  Pulse: (!) 58 71  (!) 59  Resp: 17 16 16 20   Temp:    98.6 F (37 C)  TempSrc:      SpO2: 97% 100%  100%  Weight:      Height:        Constitutional: NAD, calm, comfortable Vitals:   06/01/19 1530 06/01/19 1600 06/01/19 1630 06/01/19 1742  BP: 120/83 124/79 118/83 111/66  Pulse: (!) 58 71  (!) 59  Resp: 17 16 16 20   Temp:    98.6 F (37 C)  TempSrc:      SpO2: 97% 100%  100%  Weight:      Height:       Eyes: PERRL, lids and conjunctivae normal ENMT: Mucous membranes are moist. Posterior pharynx clear of any exudate or lesions.Normal dentition.  Neck: normal, supple, no masses, no thyromegaly Respiratory: clear to auscultation bilaterally, no wheezing, no crackles. Normal  respiratory effort. No accessory muscle use.  Cardiovascular: Regular rate and rhythm, no murmurs / rubs / gallops. No extremity edema.   Abdomen: no tenderness, no masses palpated. No hepatosplenomegaly. Bowel sounds positive.  Musculoskeletal: no clubbing / cyanosis. No joint deformity upper and lower extremities. Good ROM, no contractures. Normal muscle tone.  Skin: no rashes, lesions, ulcers. No induration Neurologic: CN 2-12 grossly intact. Sensation intact. Strength 5/5 in all 4.  Psychiatric: Normal judgment and insight. Alert and oriented x 3. Normal mood.   Labs on Admission: I have personally reviewed following labs and imaging studies  CBC: Recent Labs  Lab 06/01/19 1243  WBC 4.1  HGB 15.6  HCT 47.1  MCV 91.1  PLT 294*   Basic Metabolic Panel: Recent Labs  Lab 06/01/19 1243  NA 140  K 4.2  CL 104  CO2 27    GLUCOSE 89  BUN 23  CREATININE 1.08  CALCIUM 8.6*   GFR: Estimated Creatinine Clearance: 90.5 mL/min (by C-G formula based on SCr of 1.08 mg/dL). Liver Function Tests: No results for input(s): AST, ALT, ALKPHOS, BILITOT, PROT, ALBUMIN in the last 168 hours. No results for input(s): LIPASE, AMYLASE in the last 168 hours. No results for input(s): AMMONIA in the last 168 hours. Coagulation Profile: Recent Labs  Lab 06/01/19 1407  INR 1.1   Cardiac Enzymes: No results for input(s): CKTOTAL, CKMB, CKMBINDEX, TROPONINI in the last 168 hours. BNP (last 3 results) No results for input(s): PROBNP in the last 8760 hours. HbA1C: No results for input(s): HGBA1C in the last 72 hours. CBG: No results for input(s): GLUCAP in the last 168 hours. Lipid Profile: No results for input(s): CHOL, HDL, LDLCALC, TRIG, CHOLHDL, LDLDIRECT in the last 72 hours. Thyroid Function Tests: No results for input(s): TSH, T4TOTAL, FREET4, T3FREE, THYROIDAB in the last 72 hours. Anemia Panel: Recent Labs    06/01/19 1407  FERRITIN 324   Urine analysis:    Component Value Date/Time   COLORURINE STRAW (Rommie Dunn) 12/17/2018 1109   APPEARANCEUR CLEAR (Lennard Capek) 12/17/2018 1109   APPEARANCEUR Clear 03/16/2014 0417   LABSPEC 1.011 12/17/2018 1109   LABSPEC 1.027 03/16/2014 0417   PHURINE 7.0 12/17/2018 1109   GLUCOSEU NEGATIVE 12/17/2018 1109   GLUCOSEU Negative 03/16/2014 0417   HGBUR MODERATE (Numair Masden) 12/17/2018 1109   BILIRUBINUR NEGATIVE 12/17/2018 1109   BILIRUBINUR Negative 03/16/2014 [place] 12/17/2018 1109   PROTEINUR NEGATIVE 12/17/2018 1109   NITRITE NEGATIVE 12/17/2018 1109   LEUKOCYTESUR NEGATIVE 12/17/2018 1109   LEUKOCYTESUR Negative 03/16/2014 0417    Radiological Exams on Admission: DG Chest 2 View  Result Date: 06/01/2019 CLINICAL DATA:  Dizziness.  Fatigue.  COVID-19 virus infection. EXAM: CHEST - 2 VIEW COMPARISON:  None. FINDINGS: The heart size and mediastinal contours are  within normal limits. Both lungs are clear. The visualized skeletal structures are unremarkable. IMPRESSION: No active cardiopulmonary disease. Electronically Signed   By: Marlaine Hind M.D.   On: 06/01/2019 13:45    EKG: Independently reviewed. Sinus rhythm, LAE, RAD.  Assessment/Plan Active Problems:   COVID-19 virus infection  COVID 19 virus infection: mild cough, headache, nausea.  No SOB or x ray findings.  At this time, no indication for steroids or remdesivir.  Will continue to monitor.  Consider convalescent plasma.   Elevated Troponin secondary to NSTEMI vs Demand Ischemia vs Myocarditis in setting of COVID 19 infection: Troponins downtrending, EKG appears similar to prior - he never had any CP or SOB, but instead had  malaise, mild cough, and headache Continue heparin gtt, aspirin Follow echo Cardiology c/s, appreciate recommendations Negative d dimer, CXR without active disease  Atrial Fibrillation: continue diltiazem, currently in sinus rhythm.  Chadsvasc 1 in setting of above.  He's on anticoagulation.  Continue to monitor.  Urinary Frequency: follow UA  DVT prophylaxis: heparin  Code Status: full Family Communication: none at bedside Disposition Plan: pending further improvement, cardiology consult Consults called: none  Admission status: obs   Lacretia Nicks MD [facility] Pager AMION  If 7PM-7AM, please contact night-coverage [email or web address] Use universal [place] password for that web site. If you do not have the password, please call the hospital operator.  06/01/2019, 7:35 PM

## 2019-06-01 NOTE — Consult Note (Addendum)
ANTICOAGULATION CONSULT NOTE   Pharmacy Consult for Heparin Indication: chest pain/ACS  No Known Allergies  Patient Measurements: Height: 6\' 6"  (198.1 cm) Weight: 210 lb (95.3 kg) IBW/kg (Calculated) : 91.4 Heparin Dosing Weight: 95.3 kg  Vital Signs: Temp: 98.1 F (36.7 C) (03/12 1242) Temp Source: Oral (03/12 1242) BP: 128/70 (03/12 1345) Pulse Rate: 76 (03/12 1239)  Labs: Recent Labs    06/01/19 1023 06/01/19 1243  HGB  --  15.6  HCT  --  47.1  PLT  --  138*  CREATININE  --  1.08  TROPONINIHS 146* 122*    Estimated Creatinine Clearance: 90.5 mL/min (by C-G formula based on SCr of 1.08 mg/dL).   Medical History: Past Medical History:  Diagnosis Date  . A-fib (HCC)   . Hypotension     Medications:  (Not in a hospital admission)  Scheduled:  Infusions:  PRN:  Anti-infectives (From admission, onward)   None      Assessment: Pharmacy consulted to dose heparin for ACS. On apixaban PTA. Baseline APTT, INR, and heparin level ordered. Will use APTT to monitor APTT until Heparin level correlates.     Goal of Therapy:  Heparin level 0.3-0.7 units/ml aPTT 66-102 seconds Monitor platelets by anticoagulation protocol: Yes   Plan:  Give 4000 units bolus x 1 Start heparin infusion at 1300 units/hr Check aPTT level in 6 hours and daily while on heparin Continue to monitor H&H and platelets  08/01/19, PharmD, BCPS 06/01/2019,2:33 PM

## 2019-06-01 NOTE — ED Notes (Signed)
Troponin 1.22

## 2019-06-01 NOTE — ED Provider Notes (Signed)
[facility] Emergency Department Provider Note  ____________________________________________   First MD Initiated Contact with Patient 06/01/19 1332     (approximate)  I have reviewed the triage vital signs and the nursing notes.  History  Chief Complaint Abnormal Lab    HPI [name] is a 64 y.o. male with hx of paroxysmal AF, who presents to the ED for generalized weakness and fatigue since Wednesday.  Has felt rundown, with no energy for the last several days. Also complains of some lightheadedness, fatigue with exertion.  Initially thought his symptoms might be due to his paroxysmal atrial fibrillation, but was checking his pulse and his watch, which did not demonstrate AF.  He presented to the walk-in [facility] today for further evaluation, where he was found to be COVID positive, and also had an elevated troponin to 146.  D-dimer was negative.  He denies any chest pain or shortness of breath.  Had some brief diarrhea last week, now resolved.  No vomiting.  No fevers.   Past Medical Hx Past Medical History:  Diagnosis Date  . A-fib (HCC)   . Hypotension     Problem List There are no problems to display for this patient.   Past Surgical Hx Past Surgical History:  Procedure Laterality Date  . ABLATION      Medications Prior to Admission medications   Medication Sig Start Date End Date Taking? Authorizing Provider  apixaban (ELIQUIS) 5 MG TABS tablet Take 1 tablet (5 mg total) by mouth 2 (two) times daily. 12/17/18 01/16/19  Miguel Aschoff., MD  aspirin EC 81 MG tablet Take 81 mg by mouth daily.    [provider]  Coenzyme Q10 (COQ10) 100 MG CAPS Take 100 mg by mouth daily.    [provider]  diltiazem (CARDIZEM CD) 120 MG 24 hr capsule Take 1 capsule (120 mg total) by mouth daily. 12/17/18 01/16/19  Miguel Aschoff., MD  valACYclovir (VALTREX) 1000 MG tablet Take 1,000 mg by mouth 2 (two) times daily as needed.   07/14/18   [provider]    Allergies Patient has no known allergies.  Family Hx No family history on file.  Social Hx Social History   Tobacco Use  . Smoking status: Never Smoker  . Smokeless tobacco: Never Used  Substance Use Topics  . Alcohol use: Yes    Comment: occasional  . Drug use: Never     Review of Systems  Constitutional: Negative for fever, chills.  Positive for generalized weakness, fatigue. Eyes: Negative for visual changes. ENT: Negative for sore throat. Cardiovascular: Negative for chest pain. Respiratory: Negative for shortness of breath. Gastrointestinal: Negative for nausea, vomiting.  Genitourinary: Negative for dysuria. Musculoskeletal: Negative for leg swelling. Skin: Negative for rash. Neurological: Negative for headaches.   Physical Exam  Vital Signs: ED Triage Vitals  Enc Vitals Group     BP 06/01/19 1239 109/83     Pulse Rate 06/01/19 1239 76     Resp 06/01/19 1239 20     Temp 06/01/19 1242 98.1 F (36.7 C)     Temp Source 06/01/19 1242 Oral     SpO2 06/01/19 1239 99 %     Weight 06/01/19 1239 210 lb (95.3 kg)     Height 06/01/19 1239 6\' 6"  (1.981 m)     Head Circumference --      Peak Flow --      Pain Score 06/01/19 1239 0     Pain  Loc --      Pain Edu? --      Excl. in [place]? --     Constitutional: Alert and oriented. Well appearing.  Head: Normocephalic. Atraumatic. Eyes: Conjunctivae clear, sclera anicteric. Pupils equal and symmetric. Nose: No masses or lesions. No congestion or rhinorrhea. Mouth/Throat: Wearing mask.  Neck: No stridor. Trachea midline.  Cardiovascular: Normal rate, regular rhythm. Extremities well perfused. Respiratory: Normal respiratory effort.  Lungs CTAB. Gastrointestinal: Soft. Non-distended. Non-tender.  Genitourinary: Deferred. Musculoskeletal: No lower extremity edema. No deformities. Neurologic:  Normal speech and language. No gross focal or lateralizing neurologic deficits are  appreciated.  Skin: Skin is warm, dry and intact. No rash noted. Psychiatric: Mood and affect are appropriate for situation.  EKG  Personally reviewed and interpreted by myself.   12:49 PM 06/01/19  Rate: 71 Rhythm: sinus Axis: normal, borderline moving leftward Intervals: WNL Minimal ~1 mm ST changes I, V2. Does not meet STEMI criteria. No reciprocal changes.  No STEMI    Radiology  CXR: IMPRESSION:  No active cardiopulmonary disease   Procedures  Procedure(s) performed (including critical care):  .Critical Care Performed by: Lilia Pro., MD Authorized by: Lilia Pro., MD   Critical care provider statement:    Critical care time (minutes):  45   Critical care was necessary to treat or prevent imminent or life-threatening deterioration of the following conditions: NSTEMI.   Critical care was time spent personally by me on the following activities:  Discussions with consultants, evaluation of patient's response to treatment, examination of patient, ordering and performing treatments and interventions, ordering and review of laboratory studies, ordering and review of radiographic studies, pulse oximetry, re-evaluation of patient's condition, obtaining history from patient or surrogate and review of old charts     Initial Impression / Assessment and Plan / MDM / ED Course  65 y.o. male who presents to the ED for generalized weakness, COVID positive, elevated troponin.   Ddx: NSTEMI, demand ischemia in setting of COVID, pericarditis/myocarditis though less likely as he is not experiencing any pain or difficulty breathing  Will repeat labs here. Obtain confirmation of COVID swab.   Troponin done at [facility] was 146. Repeat here 122. D-dimer done in [facility] clinic negative. CXR negative. Will initiate heparin gtt for NSTEMI. Patient agreeable. Will obtain inflammatory markers and plan to admit.     _______________________________  As part of my medical decision making I  have reviewed available labs, radiology tests, reviewed old records, obtained additional history from family (wife).  Final Clinical Impression(s) / ED Diagnosis  Final diagnoses:  COVID-19  NSTEMI (non-ST elevated myocardial infarction) [facility])       Note:  This document was prepared using Dragon voice recognition software and may include unintentional dictation errors.   Lilia Pro., MD 06/01/19 [phone]

## 2019-06-02 DIAGNOSIS — U071 COVID-19: Secondary | ICD-10-CM | POA: Diagnosis not present

## 2019-06-02 DIAGNOSIS — I214 Non-ST elevation (NSTEMI) myocardial infarction: Secondary | ICD-10-CM | POA: Insufficient documentation

## 2019-06-02 LAB — LIPID PANEL
Cholesterol: 111 mg/dL (ref 0–200)
HDL: 32 mg/dL — ABNORMAL LOW
LDL Cholesterol: 69 mg/dL (ref 0–99)
Total CHOL/HDL Ratio: 3.5 ratio
Triglycerides: 51 mg/dL
VLDL: 10 mg/dL (ref 0–40)

## 2019-06-02 LAB — COMPREHENSIVE METABOLIC PANEL WITH GFR
ALT: 18 U/L (ref 0–44)
AST: 22 U/L (ref 15–41)
Albumin: 3.4 g/dL — ABNORMAL LOW (ref 3.5–5.0)
Alkaline Phosphatase: 44 U/L (ref 38–126)
Anion gap: 7 (ref 5–15)
BUN: 25 mg/dL — ABNORMAL HIGH (ref 8–23)
CO2: 25 mmol/L (ref 22–32)
Calcium: 7.9 mg/dL — ABNORMAL LOW (ref 8.9–10.3)
Chloride: 103 mmol/L (ref 98–111)
Creatinine, Ser: 0.87 mg/dL (ref 0.61–1.24)
GFR calc Af Amer: >60 mL/min
GFR calc non Af Amer: >60 mL/min
Glucose, Bld: 108 mg/dL — ABNORMAL HIGH (ref 70–99)
Potassium: 3.8 mmol/L (ref 3.5–5.1)
Sodium: 135 mmol/L (ref 135–145)
Total Bilirubin: 0.8 mg/dL (ref 0.3–1.2)
Total Protein: 5.7 g/dL — ABNORMAL LOW (ref 6.5–8.1)

## 2019-06-02 LAB — CBC
HCT: 40.4 % (ref 39.0–52.0)
Hemoglobin: 13.6 g/dL (ref 13.0–17.0)
MCH: 30.4 pg (ref 26.0–34.0)
MCHC: 33.7 g/dL (ref 30.0–36.0)
MCV: 90.4 fL (ref 80.0–100.0)
Platelets: 118 10*3/uL — ABNORMAL LOW (ref 150–400)
RBC: 4.47 MIL/uL (ref 4.22–5.81)
RDW: 12.8 % (ref 11.5–15.5)
WBC: 3.1 10*3/uL — ABNORMAL LOW (ref 4.0–10.5)
nRBC: 0 % (ref 0.0–0.2)

## 2019-06-02 LAB — HIV ANTIBODY (ROUTINE TESTING W REFLEX): HIV Screen 4th Generation wRfx: NONREACTIVE

## 2019-06-02 LAB — HEPARIN LEVEL (UNFRACTIONATED): Heparin Unfractionated: 0.79 IU/mL — ABNORMAL HIGH (ref 0.30–0.70)

## 2019-06-02 MED ORDER — APIXABAN 5 MG PO TABS
5.0000 mg | ORAL_TABLET | Freq: Two times a day (BID) | ORAL | Status: DC
  Administered 2019-06-02: 10:00:00 5 mg via ORAL
  Filled 2019-06-02: qty 1

## 2019-06-02 MED ORDER — DILTIAZEM HCL ER 120 MG PO CP24: 120.0000 mg | ORAL_CAPSULE | Freq: Every day | ORAL | Status: DC

## 2019-06-02 MED ORDER — ASPIRIN EC 81 MG PO TBEC
81.0000 mg | DELAYED_RELEASE_TABLET | Freq: Every day | ORAL | Status: DC
  Administered 2019-06-02: 81 mg via ORAL
  Filled 2019-06-02: qty 1

## 2019-06-02 MED ORDER — COQ10 100 MG PO CAPS: 100.0000 mg | ORAL_CAPSULE | Freq: Every day | ORAL | Status: DC

## 2019-06-02 NOTE — Consult Note (Signed)
[facility] Consultation Note  Patient ID: [name], MRN: [id number], DOB/AGE: [birth year] 64 y.o. Admit date: 06/01/2019   Date of Consult: 06/02/2019 Primary Physician: Overton Mam, MD Primary Cardiologist: Ubaldo Glassing  Chief Complaint:  Chief Complaint  Patient presents with  . Abnormal Lab   Reason for Consult: Atrial fibrillation and elevated troponin  HPI: 64 y.o. male with known paroxysmal nonvalvular atrial fibrillation status post previous ablation on appropriate medication management including Eliquis for further risk reduction in stroke as well as diltiazem for heart rate and maintenance of normal rhythm.  The patient has done well with this therapy with no issues at all.  He has not had any other hyperlipidemia and other concerns at this time requiring further interventions.  He has had significant new weakness fatigue and concerns of Covid infection for which a chest x-ray was normal.  He did have a EKG showing normal sinus rhythm with left atrial enlargement otherwise normal EKG.  With his fatigue and symptoms he has a troponin of 146 most consistent with demand ischemia rather than acute coronary syndrome.  Currently there does not appear to be any telemetry changes consistent with atrial fibrillation.  Past Medical History:  Diagnosis Date  . A-fib ([place])   . Hypotension       Surgical History:  Past Surgical History:  Procedure Laterality Date  . ABLATION       Home Meds: Prior to Admission medications   Medication Sig Start Date End Date Taking? Authorizing Provider  apixaban (ELIQUIS) 5 MG TABS tablet Take 5 mg by mouth 2 (two) times daily as needed (uncorrectable AFib episode).   Yes [provider]  aspirin EC 81 MG tablet Take 81 mg by mouth daily.   Yes [provider]  Coenzyme Q10 (COQ10) 100 MG CAPS Take 100 mg by mouth daily.   Yes [provider]  diltiazem (DILACOR XR) 120 MG 24 hr capsule Take 120 mg by mouth  daily.   Yes [provider]  valACYclovir (VALTREX) 1000 MG tablet Take 1,000 mg by mouth 2 (two) times daily as needed (breakout).   Yes [provider]    Inpatient Medications:  . aspirin  81 mg Oral Daily  . influenza vac split quadrivalent PF  0.5 mL Intramuscular Tomorrow-1000   . heparin 1,300 Units/hr (06/02/19 0600)    Allergies: No Known Allergies  Social History   Socioeconomic History  . Marital status: Married    Spouse name: Not on file  . Number of children: Not on file  . Years of education: Not on file  . Highest education level: Not on file  Occupational History  . Not on file  Tobacco Use  . Smoking status: Never Smoker  . Smokeless tobacco: Never Used  Substance and Sexual Activity  . Alcohol use: Yes    Comment: occasional  . Drug use: Never  . Sexual activity: Not on file  Other Topics Concern  . Not on file  Social History Narrative  . Not on file   Social Determinants of Health   Financial Resource Strain:   . Difficulty of Paying Living Expenses:   Food Insecurity:   . Worried About Charity fundraiser in the Last Year:   . Arboriculturist in the Last Year:   Transportation Needs:   . Film/video editor (Medical):   Marland Kitchen Lack of Transportation (Non-Medical):   Physical Activity:   . Days of Exercise per Week:   .  Minutes of Exercise per Session:   Stress:   . Feeling of Stress :   Social Connections:   . Frequency of Communication with Friends and Family:   . Frequency of Social Gatherings with Friends and Family:   . Attends Religious Services:   . Active Member of Clubs or Organizations:   . Attends Banker Meetings:   Marland Kitchen Marital Status:   Intimate Partner Violence:   . Fear of Current or Ex-Partner:   . Emotionally Abused:   Marland Kitchen Physically Abused:   . Sexually Abused:      History reviewed. No pertinent family history.   Review of Systems   Labs: No results for input(s): CKTOTAL, CKMB,  TROPONINI in the last 72 hours. Lab Results  Component Value Date   WBC 4.1 06/01/2019   HGB 15.6 06/01/2019   HCT 47.1 06/01/2019   MCV 91.1 06/01/2019   PLT 138 (L) 06/01/2019    Recent Labs  Lab 06/01/19 1243  NA 140  K 4.2  CL 104  CO2 27  BUN 23  CREATININE 1.08  CALCIUM 8.6*  GLUCOSE 89   No results found for: CHOL, HDL, LDLCALC, TRIG No results found for: DDIMER  Radiology/Studies:  DG Chest 2 View  Result Date: 06/01/2019 CLINICAL DATA:  Dizziness.  Fatigue.  COVID-19 virus infection. EXAM: CHEST - 2 VIEW COMPARISON:  None. FINDINGS: The heart size and mediastinal contours are within normal limits. Both lungs are clear. The visualized skeletal structures are unremarkable. IMPRESSION: No active cardiopulmonary disease. Electronically Signed   By: Danae Orleans M.D.   On: 06/01/2019 13:45    EKG: Normal sinus rhythm with left atrial enlargement  Weights: Filed Weights   06/01/19 1239  Weight: 95.3 kg     Physical Exam: Blood pressure 101/64, pulse 63, temperature 98.2 F (36.8 C), temperature source Oral, resp. rate 13, height 6\' 6"  (1.981 m), weight 95.3 kg, SpO2 96 %. Body mass index is 24.27 kg/m. As per prime doc    Assessment: 64 year old male with paroxysmal nonvalvular atrial fibrillation status post ablation remaining in normal sinus rhythm on appropriate medication management with episode of weakness fatigue shortness of breath consistent with current infection most and elevated troponin consistent with demand ischemia rather than acute coronary syndrome  Plan: 1.  Continue supportive care for infection weakness fatigue potential hypoxia 2.  No further cardiac intervention of minimal elevation of troponin most consistent with demand ischemia rather than acute coronary syndrome 3.  Continue diltiazem for heart rate control and maintenance of normal sinus rhythm 4.  No change in anticoagulation for further risk reduction in stroke with atrial  fibrillation 5.  No further cardiac diagnostics necessary at this time  Signed, 65 M.D. [facility] 06/02/2019, 7:16 AM

## 2019-06-02 NOTE — Consult Note (Signed)
ANTICOAGULATION CONSULT NOTE   Pharmacy Consult for Heparin Indication: chest pain/ACS  No Known Allergies  Patient Measurements: Height: 6\' 6"  (198.1 cm) Weight: 210 lb (95.3 kg) IBW/kg (Calculated) : 91.4 Heparin Dosing Weight: 95.3 kg  Vital Signs: Temp: 98.2 F (36.8 C) (03/12 2300) Temp Source: Oral (03/12 2300) BP: 101/64 (03/13 0200) Pulse Rate: 63 (03/13 0200)  Labs: Recent Labs    06/01/19 1023 06/01/19 1243 06/01/19 1407 06/01/19 2236 06/02/19 0647  HGB  --  15.6  --   --  13.6  HCT  --  47.1  --   --  40.4  PLT  --  138*  --   --  118*  APTT  --   --  35 >160*  --   LABPROT  --   --  13.9  --   --   INR  --   --  1.1  --   --   HEPARINUNFRC  --   --  <0.10*  --  0.79*  CREATININE  --  1.08  --   --   --   TROPONINIHS 146* 122* 96*  98*  --   --     Estimated Creatinine Clearance: 90.5 mL/min (by C-G formula based on SCr of 1.08 mg/dL).   Medical History: Past Medical History:  Diagnosis Date  . A-fib ([facility])   . Hypotension     Medications:  Medications Prior to Admission  Medication Sig Dispense Refill Last Dose  . apixaban (ELIQUIS) 5 MG TABS tablet Take 5 mg by mouth 2 (two) times daily as needed (uncorrectable AFib episode).   Uknown at PRN  . aspirin EC 81 MG tablet Take 81 mg by mouth daily.   Unknown at Unknown  . Coenzyme Q10 (COQ10) 100 MG CAPS Take 100 mg by mouth daily.     06/04/19 diltiazem (DILACOR XR) 120 MG 24 hr capsule Take 120 mg by mouth daily.   24+ hours at Unknown  . valACYclovir (VALTREX) 1000 MG tablet Take 1,000 mg by mouth 2 (two) times daily as needed (breakout).   Unknown at PRN   Scheduled:  Infusions:  PRN:  Anti-infectives (From admission, onward)   None      Assessment: Pharmacy consulted to dose heparin for ACS. On apixaban PTA. Baseline APTT, INR, and heparin level ordered. Will use APTT to monitor APTT until Heparin level correlates.     Goal of Therapy:  Heparin level 0.3-0.7 units/ml aPTT 66-102  seconds Monitor platelets by anticoagulation protocol: Yes   Plan:  03/13 @ 0500 HL 0.79 supratherapeutic. Will decrease rate to 1000 units/hr and will recheck HL at 1300, CBC trending down will continue to monitor.  4/13, PharmD, BCPS 06/02/2019,7:32 AM

## 2019-06-02 NOTE — Discharge Summary (Signed)
[facility] [place] NAME: [name]    MR#:  [id number]  DATE OF BIRTH:  April 14, 1955  DATE OF ADMISSION:  06/01/2019 ADMITTING PHYSICIAN: A Melven Sartorius., MD  DATE OF DISCHARGE: 06/02/2019  PRIMARY CARE PHYSICIAN: Overton Mam, MD    ADMISSION DIAGNOSIS:  NSTEMI (non-ST elevated myocardial infarction) ([name]) [I21.4] COVID-19 virus infection [U07.1] COVID-19 [U07.1]  DISCHARGE DIAGNOSIS:  COVID -19 infection Paroxysmal Afib  SECONDARY DIAGNOSIS:   Past Medical History:  Diagnosis Date  . A-fib ([name])   . Hypotension     HOSPITAL COURSE:  [name] is a 64 y.o. male with medical history significant of paroxysmal afib who presented to the ED with generalized weakness and fatigue.  He notes about 2 Sundays ago, he felt similarly, but was in afib most of the day, so he attributed this to that.  He was seen in the walk in [place] clinic and was found to be covid positive.  He presented here with positive tropoin.  COVID 19 virus infection: mild cough, headache, nausea.  No SOB or x ray findings.  At this time, no indication for steroids or remdesivir.  Will continue to monitor.  -pt remains stable so far -some malaise   Elevated Troponin secondary to Demand Ischemia  in setting of COVID 19 infection: -Troponins downtrending, EKG appears similar to prior - he never had any CP or SOB, but instead had malaise, mild cough, and headache -Was on  heparin gtt, aspirin--chang to eliquis like before -Cont cardizem -Cardiology c/s, appreciate recommendations--this is demand ischemia  -Negative d dimer, CXR without active disease  Atrial Fibrillation: continue diltiazem, currently in sinus rhythm.  Chadsvasc 1 in setting of above.  He's on anticoagulation.    Pt eager to go home. Overall stable for d/c  DVT prophylaxis: heparin  Code Status: full Family Communication: none at bedside Disposition Plan: home today Consults  called: cardiology  CONSULTS OBTAINED:    DRUG ALLERGIES:  No Known Allergies  DISCHARGE MEDICATIONS:   Allergies as of 06/02/2019   No Known Allergies     Medication List    TAKE these medications   aspirin EC 81 MG tablet Take 81 mg by mouth daily.   CoQ10 100 MG Caps Take 100 mg by mouth daily.   diltiazem 120 MG 24 hr capsule Commonly known as: DILACOR XR Take 120 mg by mouth daily.   Eliquis 5 MG Tabs tablet Generic drug: apixaban Take 5 mg by mouth 2 (two) times daily as needed (uncorrectable AFib episode).   valACYclovir 1000 MG tablet Commonly known as: VALTREX Take 1,000 mg by mouth 2 (two) times daily as needed (breakout).       If you experience worsening of your admission symptoms, develop shortness of breath, life threatening emergency, suicidal or homicidal thoughts you must seek medical attention immediately by calling 911 or calling your MD immediately  if symptoms less severe.  You Must read complete instructions/literature along with all the possible adverse reactions/side effects for all the Medicines you take and that have been prescribed to you. Take any new Medicines after you have completely understood and accept all the possible adverse reactions/side effects.   Please note  You were cared for by a hospitalist during your hospital stay. If you have any questions about your discharge medications or the care you received while you were in the hospital after you are discharged, you can call the unit and asked to  speak with the hospitalist on call if the hospitalist that took care of you is not available. Once you are discharged, your primary care physician will handle any further medical issues. Please note that NO REFILLS for any discharge medications will be authorized once you are discharged, as it is imperative that you return to your primary care physician (or establish a relationship with a primary care physician if you do not have one) for your  aftercare needs so that they can reassess your need for medications and monitor your lab values. Today   SUBJECTIVE   Feels weak but noting has worsened Eager to go home  VITAL SIGNS:  Blood pressure 121/70, pulse 68, temperature 99.1 F (37.3 C), temperature source Oral, resp. rate 20, height 6\' 6"  (1.981 m), weight 95.3 kg, SpO2 96 %.  I/O:    Intake/Output Summary (Last 24 hours) at 06/02/2019 1103 Last data filed at 06/02/2019 0600 Gross per 24 hour  Intake 231.79 ml  Output --  Net 231.79 ml    PHYSICAL EXAMINATION:  GENERAL:  64 y.o.-year-old patient lying in the bed with no acute distress.  EYES: Pupils equal, round, reactive to light and accommodation. No scleral icterus.  HEENT: Head atraumatic, normocephalic. Oropharynx and nasopharynx clear.  NECK:  Supple, no jugular venous distention. No thyroid enlargement, no tenderness.  LUNGS: Normal breath sounds bilaterally, no wheezing, rales,rhonchi or crepitation. No use of accessory muscles of respiration.  CARDIOVASCULAR: S1, S2 normal. No murmurs, rubs, or gallops.  ABDOMEN: Soft, non-tender, non-distended. Bowel sounds present. No organomegaly or mass.  EXTREMITIES: No pedal edema, cyanosis, or clubbing.  NEUROLOGIC: Cranial nerves II through XII are intact. Muscle strength 5/5 in all extremities. Sensation intact. Gait not checked.  PSYCHIATRIC: The patient is alert and oriented x 3.  SKIN: No obvious rash, lesion, or ulcer.   DATA REVIEW:   CBC  Recent Labs  Lab 06/02/19 0647  WBC 3.1*  HGB 13.6  HCT 40.4  PLT 118*    Chemistries  Recent Labs  Lab 06/02/19 0647  NA 135  K 3.8  CL 103  CO2 25  GLUCOSE 108*  BUN 25*  CREATININE 0.87  CALCIUM 7.9*  AST 22  ALT 18  ALKPHOS 44  BILITOT 0.8    Microbiology Results   No results found for this or any previous visit (from the past 240 hour(s)).  RADIOLOGY:  DG Chest 2 View  Result Date: 06/01/2019 CLINICAL DATA:  Dizziness.  Fatigue.  COVID-19  virus infection. EXAM: CHEST - 2 VIEW COMPARISON:  None. FINDINGS: The heart size and mediastinal contours are within normal limits. Both lungs are clear. The visualized skeletal structures are unremarkable. IMPRESSION: No active cardiopulmonary disease. Electronically Signed   By: 08/01/2019 M.D.   On: 06/01/2019 13:45     CODE STATUS:     Code Status Orders  (From admission, onward)         Start     Ordered   06/01/19 1638  Full code  Continuous     06/01/19 1637        Code Status History    This patient has a current code status but no historical code status.   Advance Care Planning Activity       TOTAL TIME TAKING CARE OF THIS PATIENT: *35* minutes.    08/01/19 M.D  [facility]    CC: Primary care physician; Enedina Finner, MD

## 2021-05-14 IMAGING — CT CT ANGIO CHEST
3 of 6 series · 16 of 46 positions shown · IV contrast (APPLIED)
Comparison: CT abdomen and pelvis-03/16/2014

CLINICAL DATA: Abnormal cardiac echo questioning thoracic aneurysm.

EXAM:
CT ANGIOGRAPHY CHEST WITH CONTRAST
TECHNIQUE: Multidetector CT imaging of the chest was performed using the
standard protocol during bolus administration of intravenous
contrast. Multiplanar CT image reconstructions and MIPs were
obtained to evaluate the vascular anatomy.
CONTRAST:  125mL OMNIPAQUE IOHEXOL 350 MG/ML SOLN

[Series 4: axial arterial · axial · arterial · 0.66mm/px · z∈[-607,-289]mm · 11 of 128 slices shown]
[im 11/128  lung]
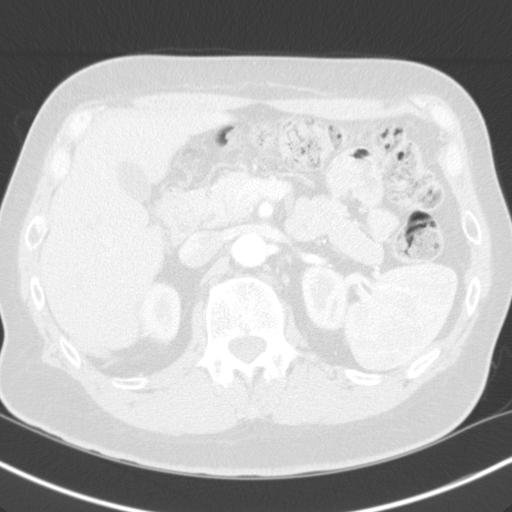
[im 22/128  soft-tissue]
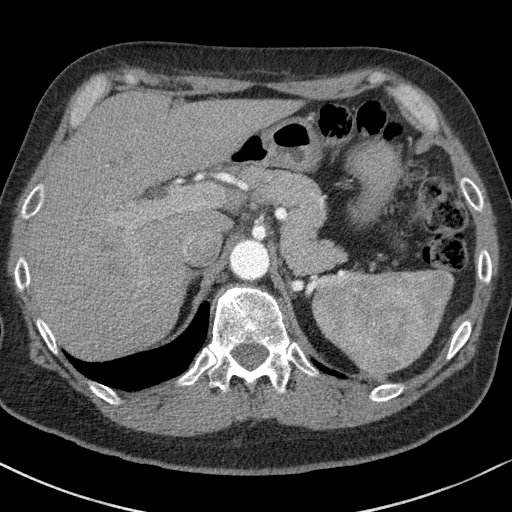
[im 32/128  lung]
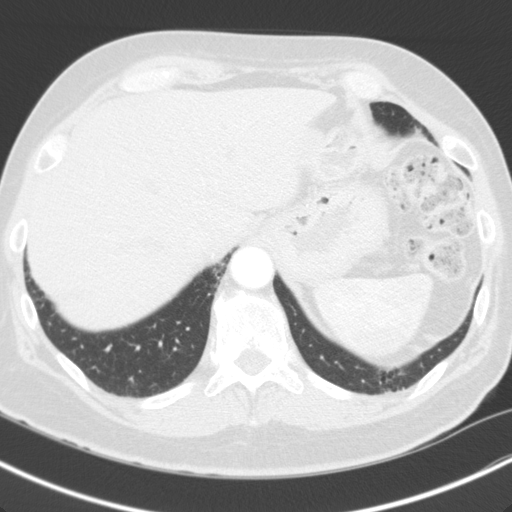
[im 43/128  soft-tissue]
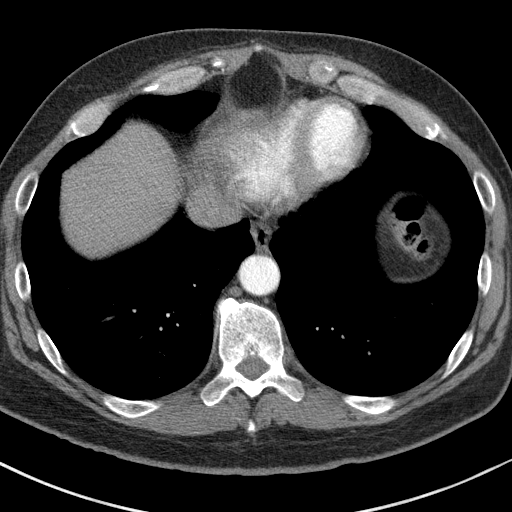
[im 53/128  lung]
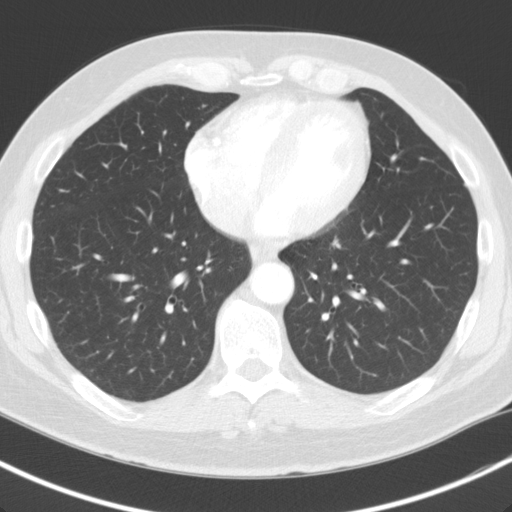
[im 64/128  soft-tissue]
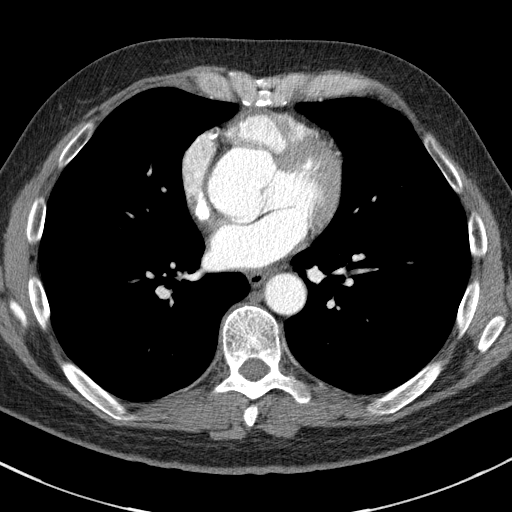
[im 75/128  lung]
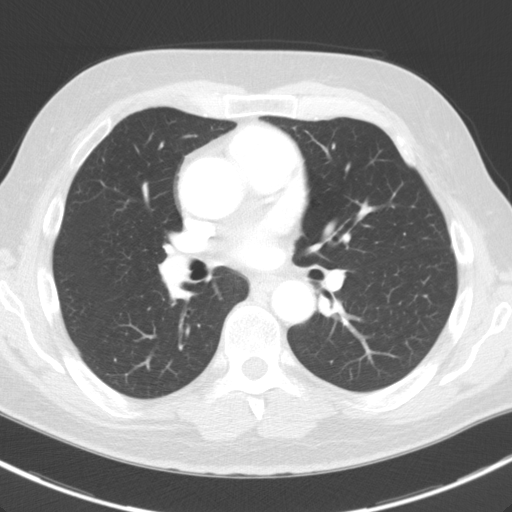
[im 85/128  soft-tissue]
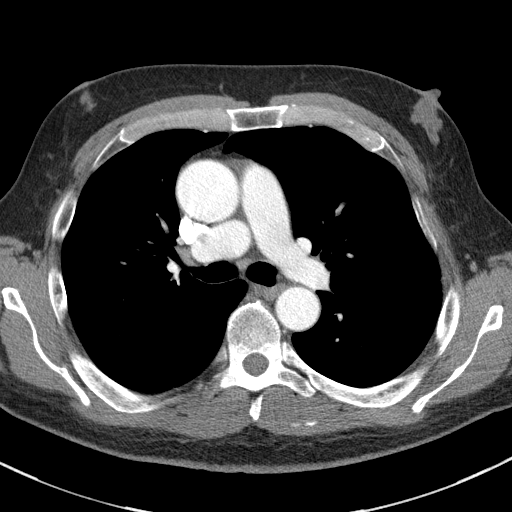
[im 96/128  lung]
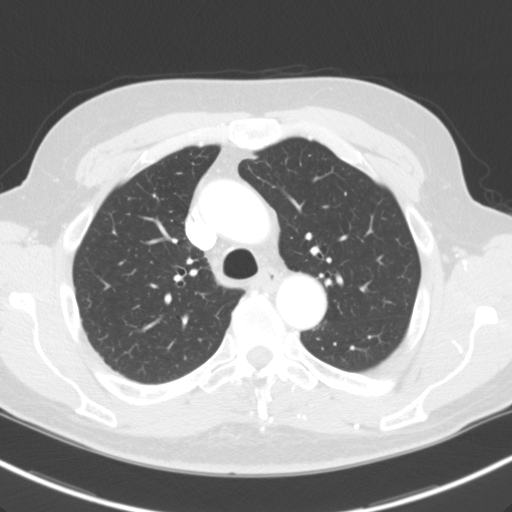
[im 106/128  soft-tissue]
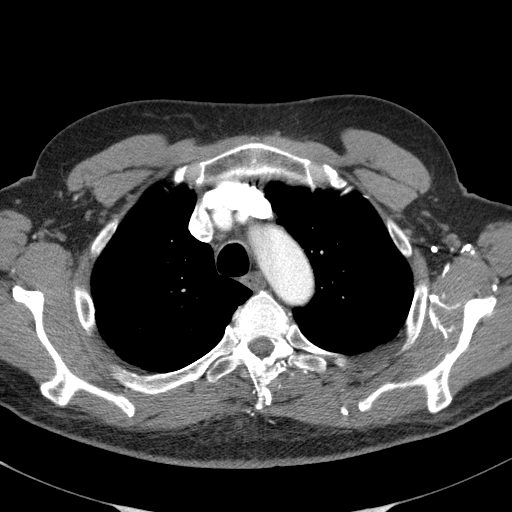
[im 117/128  lung]
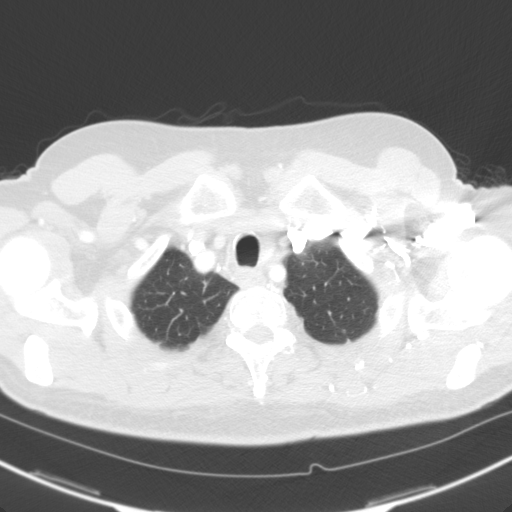

[Series 6: lung · axial · 0.66mm/px · z∈[-588,-546]mm · 2 of 187 slices shown]
[im 21/187  soft-tissue]
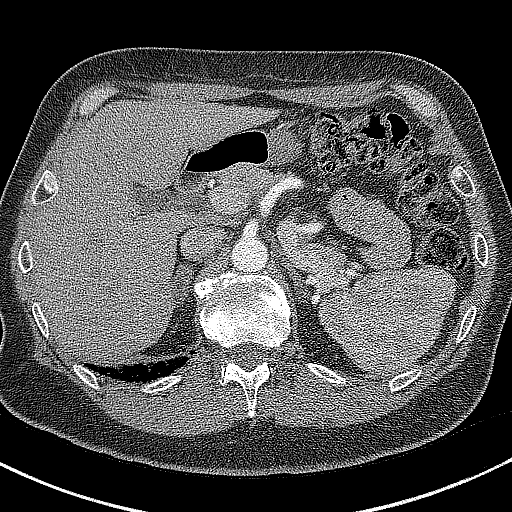
[im 42/187  soft-tissue]
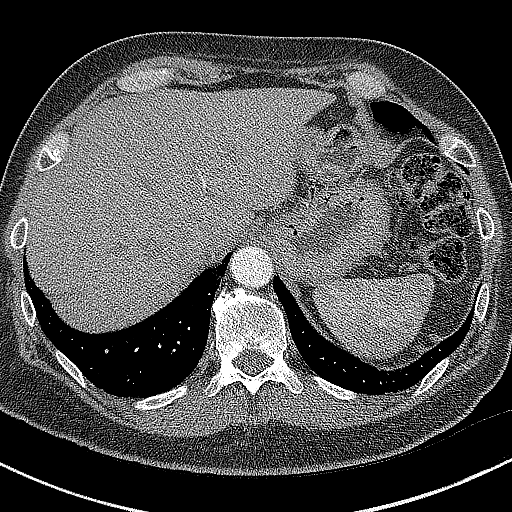

[Series 7: coronal · coronal · 0.71mm/px · 3 of 95 slices shown]
[im 24/95  soft-tissue]
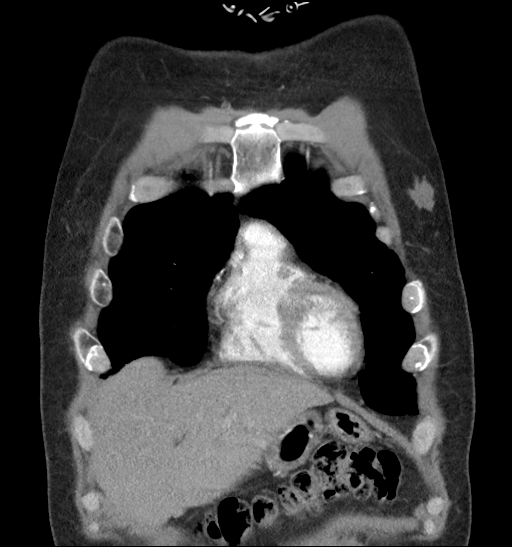
[im 48/95  soft-tissue]
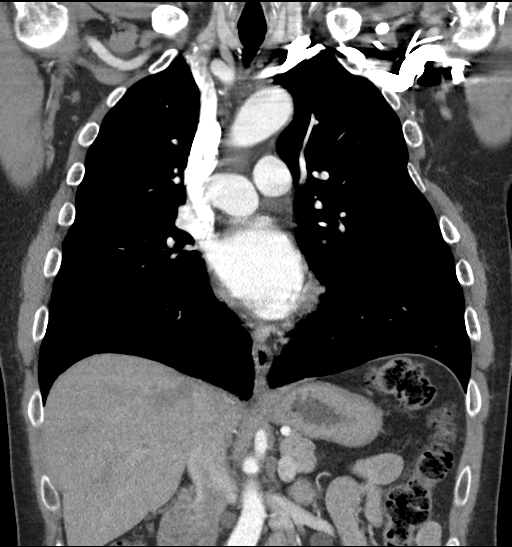
[im 71/95  soft-tissue]
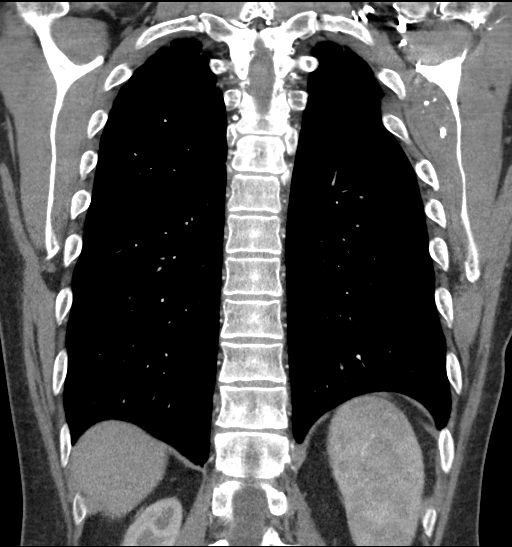

[16 of 46 positions shown; findings below may reference images not displayed]

FINDINGS: Vascular Findings:

Mild fusiform aneurysmal dilatation of the ascending thoracic aorta
was measurements as follows. The thoracic aorta tapers to a normal
caliber at the level of the aortic arch. No evidence of thoracic
aortic dissection or periaortic stranding on this nongated
examination.

There is a minimal amount calcified atherosclerotic plaque involving
the undersurface of the aortic arch, not resulting in
hemodynamically significant stenosis. Note is made of a small ductus
diverticulum.

Conventional configuration of the aortic arch. The branch vessels of
the aortic arch appear patent throughout their imaged courses.

Borderline cardiomegaly.  No pericardial effusion.

Although this examination was not tailored for the evaluation the
pulmonary arteries, there are no discrete filling defects within the
central pulmonary arterial tree to suggest central pulmonary
embolism. Borderline enlarged caliber of the main pulmonary artery
measuring 31 mm in diameter.

-------------------------------------------------------------

Thoracic aortic measurements:

Aortic root:

51 mm in greatest oblique axial diameter (image 66, series 4).

Sinotubular junction

40 mm as measured in greatest oblique short axis coronal dimension.

Proximal ascending aorta

43 mm as measured in greatest oblique short axis axial dimension at
the level of the main pulmonary artery (axial image 48, series 4)
and approximately 41 mm in greatest oblique short axis coronal
diameter (coronal image 37, series 7).

Aortic arch aorta

29 mm as measured in greatest oblique short axis sagittal dimension.

Proximal descending thoracic aorta

29 mm as measured in greatest oblique short axis axial dimension at
the level of the main pulmonary artery.

Distal descending thoracic aorta

20 mm as measured in greatest oblique short axis axial dimension at
the level of the diaphragmatic hiatus.

Review of the MIP images confirms the above findings.

-------------------------------------------------------------

Non-Vascular Findings:

Mediastinum/Lymph Nodes: No bulky mediastinal, hilar or axillary
lymphadenopathy.

Lungs/Pleura: Minimal dependent subpleural slightly nodular
atelectasis. No discrete focal airspace opacities. Minimal varicoid
bronchiectasis involving a left posterior basilar bronchus
(representative images 136 through 146, series 6) without associated
bronchial wall thickening are additional areas of bronchiectasis,
presumably post infectious/inflammatory. No air bronchograms. The
remaining central pulmonary airways appear widely patent. No pleural
effusion or pneumothorax. No discrete pulmonary nodules.

Upper abdomen: Limited early arterial phase evaluation of the upper
abdomen demonstrates a punctate (approximately 0.8 cm)
hypoattenuating lesion within the superior pole the left kidney
(image 125, series 4), too small to accurately characterize though
unchanged since the 2586 examination and favored to represent a
renal cyst. Mild grossly symmetric bilateral pelviectasis, similar
to the [DATE] examination and compatible with known bilateral
extrarenal pelvis ease in the setting of previously identified
horseshoe kidney.

Musculoskeletal: No acute or aggressive osseous abnormalities.
Symmetric bilateral gynecomastia. The thyroid gland appears slightly
atrophic but without discrete nodule.
IMPRESSION: 1. Uncomplicated mild aneurysmal dilatation of the aortic root and
ascending thoracic aorta with the ascending thoracic aorta measuring
43 mm in diameter. Recommend annual imaging followup by CTA or MRA.
This recommendation follows 7010
ACCF/AHA/AATS/ACR/ASA/SCA/HAMAYAK/SAMUKA/TSERING/SCHIFFER Guidelines for the
Diagnosis and Management of Patients with Thoracic Aortic Disease.
Circulation. 7010; 121: [id number].
2. Aortic aneurysm NOS (311RU-QTH.S)
3.  Aortic Atherosclerosis (311RU-W6B.B).
4. Borderline cardiomegaly with mild enlargement of the caliber of
the main pulmonary artery, nonspecific though could be seen in the
setting of pulmonary arterial hypertension.
